# Patient Record
Sex: Female | Born: 1950 | Race: White | Hispanic: No | Smoking: Former smoker
Health system: Southern US, Community
[De-identification: ages and names within clinical notes are randomized; demographics above are authoritative.]

## PROBLEM LIST (undated history)

## (undated) DIAGNOSIS — E039 Hypothyroidism, unspecified: Secondary | ICD-10-CM

## (undated) DIAGNOSIS — N189 Chronic kidney disease, unspecified: Secondary | ICD-10-CM

## (undated) DIAGNOSIS — T8859XA Other complications of anesthesia, initial encounter: Secondary | ICD-10-CM

## (undated) DIAGNOSIS — Z87891 Personal history of nicotine dependence: Principal | ICD-10-CM

## (undated) HISTORY — PX: ABDOMINAL HYSTERECTOMY: SHX81

## (undated) HISTORY — PX: TONSILLECTOMY: SUR1361

## (undated) HISTORY — PX: OTHER SURGICAL HISTORY: SHX169

## (undated) HISTORY — DX: Personal history of nicotine dependence: Z87.891

---

## 1978-07-28 HISTORY — PX: ABDOMINAL HYSTERECTOMY: SHX81

## 2010-06-02 ENCOUNTER — Emergency Department: Payer: Self-pay | Admitting: Emergency Medicine

## 2014-01-06 ENCOUNTER — Ambulatory Visit: Payer: Self-pay | Admitting: Internal Medicine

## 2014-12-15 ENCOUNTER — Other Ambulatory Visit: Payer: Self-pay | Admitting: Family Medicine

## 2014-12-15 ENCOUNTER — Ambulatory Visit
Admission: RE | Admit: 2014-12-15 | Discharge: 2014-12-15 | Disposition: A | Discharge status: Home / Self Care | Payer: 59 | Source: Ambulatory Visit | Attending: Family Medicine | Admitting: Family Medicine

## 2014-12-15 ENCOUNTER — Inpatient Hospital Stay: Payer: 59 | Attending: Family Medicine | Admitting: Family Medicine

## 2014-12-15 DIAGNOSIS — Z87891 Personal history of nicotine dependence: Secondary | ICD-10-CM | POA: Insufficient documentation

## 2014-12-15 DIAGNOSIS — Z720 Tobacco use: Secondary | ICD-10-CM

## 2014-12-15 DIAGNOSIS — Z122 Encounter for screening for malignant neoplasm of respiratory organs: Secondary | ICD-10-CM

## 2014-12-15 NOTE — Progress Notes (Signed)
In accordance with CMS guidelines, patient has meet eligibility criteria including age, absence of signs or symptoms of lung cancer, the specific calculation of cigarette smoking pack-years was 49 years and is a former smoker that quit 3 years ago.   A shared decision-making session was conducted prior to the performance of CT scan. This includes one or more decision aids, includes benefits and harms of screening, follow-up diagnostic testing, over-diagnosis, false positive rate, and total radiation exposure.  Counseling on the importance of adherence to annual lung cancer LDCT screening, impact of co-morbidities, and ability or willingness to undergo diagnosis and treatment is imperative for compliance of the program.  Counseling on the importance of continued smoking cessation for former smokers; the importance of smoking cessation for current smokers and information about tobacco cessation interventions have been given to patient including the [place] [facility], [address] quit [place], as well as Cancer Center specific smoking cessation programs.  Written order for lung cancer screening with LDCT has been given to the patient and any and all questions have been answered to the best of my abilities.   Yearly follow up will be scheduled by Glenna FellowsShawn Perkins, Thoracic Navigator.

## 2014-12-18 ENCOUNTER — Telehealth: Payer: Self-pay | Admitting: *Deleted

## 2014-12-18 NOTE — Telephone Encounter (Signed)
Oncology Nurse Navigator Documentation  Oncology Nurse Navigator Flowsheets 12/18/2014  Navigator Encounter Type Screening;Education  Interventions Coordination of Care  Coordination of Care Radiology  Time Spent with Patient 30   Notified patient of LDCT lung cancer screening results of Lung [place]   1  finding with recommendation for 12 month follow up imaging. Also notified of incidental finding of hepatic cysts and mild emphysema.A copy of this report will be sent to Dr. Dareen PianoAnderson.

## 2015-11-26 ENCOUNTER — Telehealth: Payer: Self-pay | Admitting: *Deleted

## 2015-11-26 NOTE — Telephone Encounter (Signed)
Attempted to notify patient that it is time to schedule the recommended follow up lung cancer screening low dose CT scan. Unable to leave a message on patients cell phone.

## 2015-12-10 ENCOUNTER — Telehealth: Payer: Self-pay | Admitting: *Deleted

## 2015-12-10 NOTE — Telephone Encounter (Signed)
Unable to successfully reach patient by phone. A letter has been sent informing patient it is time for their recommended follow up low dose CT scan for lung cancer screening. 

## 2015-12-18 ENCOUNTER — Telehealth: Payer: Self-pay | Admitting: *Deleted

## 2015-12-18 NOTE — Telephone Encounter (Signed)
Notified patient that it is time to have lung cancer screening scan done. Verified patient's age, no signs of lung cancer, no illness that would prevent patient from receiving treatment for lung cancer, and smoking history (former, quit 2013, 49 pack year history). Patient is expecting call from scheduling with appointment for screening scan and knows to call me with any questions. Initial shared decision making visit was 12/15/14.

## 2016-01-02 ENCOUNTER — Encounter: Payer: Self-pay | Admitting: Family Medicine

## 2016-01-02 ENCOUNTER — Other Ambulatory Visit: Payer: Self-pay | Admitting: Family Medicine

## 2016-01-02 DIAGNOSIS — Z87891 Personal history of nicotine dependence: Secondary | ICD-10-CM

## 2016-01-02 HISTORY — DX: Personal history of nicotine dependence: Z87.891

## 2016-01-16 ENCOUNTER — Ambulatory Visit
Admission: RE | Admit: 2016-01-16 | Discharge: 2016-01-16 | Disposition: A | Discharge status: Home / Self Care | Payer: BLUE CROSS/BLUE SHIELD | Source: Ambulatory Visit | Attending: Family Medicine | Admitting: Family Medicine

## 2016-01-16 DIAGNOSIS — Z87891 Personal history of nicotine dependence: Secondary | ICD-10-CM | POA: Insufficient documentation

## 2016-12-16 ENCOUNTER — Telehealth: Payer: Self-pay | Admitting: *Deleted

## 2016-12-16 ENCOUNTER — Telehealth: Payer: Self-pay

## 2016-12-16 DIAGNOSIS — Z87891 Personal history of nicotine dependence: Secondary | ICD-10-CM

## 2016-12-16 NOTE — Telephone Encounter (Signed)
Spoke with patient.  01/16/17 @ 10am arrive 15 min early.  [facility]

## 2016-12-16 NOTE — Telephone Encounter (Signed)
Notified patient that annual lung cancer screening low dose CT scan is due currently or will be in near future. Confirmed that patient is within the age range of 55-77, and asymptomatic, (no signs or symptoms of lung cancer). Patient denies illness that would prevent curative treatment for lung cancer if found. Verified smoking history, (former, quit 2013, 49 pack year). The shared decision making visit was done 12/15/14. Patient is agreeable for CT scan being scheduled.

## 2017-01-02 ENCOUNTER — Other Ambulatory Visit
Admission: RE | Admit: 2017-01-02 | Discharge: 2017-01-02 | Disposition: A | Discharge status: Home / Self Care | Payer: Medicare Other | Source: Ambulatory Visit | Attending: Internal Medicine | Admitting: Internal Medicine

## 2017-01-02 DIAGNOSIS — E039 Hypothyroidism, unspecified: Secondary | ICD-10-CM | POA: Diagnosis present

## 2017-01-02 DIAGNOSIS — E782 Mixed hyperlipidemia: Secondary | ICD-10-CM | POA: Insufficient documentation

## 2017-01-02 DIAGNOSIS — G43709 Chronic migraine without aura, not intractable, without status migrainosus: Secondary | ICD-10-CM | POA: Insufficient documentation

## 2017-01-02 LAB — BASIC METABOLIC PANEL
Anion gap: 8 (ref 5–15)
BUN: 16 mg/dL (ref 6–20)
CHLORIDE: 106 mmol/L (ref 101–111)
CO2: 26 mmol/L (ref 22–32)
CREATININE: 0.9 mg/dL (ref 0.44–1.00)
Calcium: 9.1 mg/dL (ref 8.9–10.3)
GFR calc Af Amer: >60 mL/min (ref >60)
GFR calc non Af Amer: >60 mL/min (ref >60)
Glucose, Bld: 93 mg/dL (ref 65–99)
POTASSIUM: 3.8 mmol/L (ref 3.5–5.1)
Sodium: 140 mmol/L (ref 135–145)

## 2017-01-02 LAB — HEPATIC FUNCTION PANEL
ALBUMIN: 4.2 g/dL (ref 3.5–5.0)
ALK PHOS: 77 U/L (ref 38–126)
ALT: 15 U/L (ref 14–54)
AST: 20 U/L (ref 15–41)
BILIRUBIN TOTAL: 0.9 mg/dL (ref 0.3–1.2)
Bilirubin, Direct: 0.1 mg/dL (ref 0.1–0.5)
Indirect Bilirubin: 0.8 mg/dL (ref 0.3–0.9)
TOTAL PROTEIN: 7 g/dL (ref 6.5–8.1)

## 2017-01-02 LAB — LIPID PANEL
Cholesterol: 217 mg/dL — ABNORMAL HIGH (ref 0–200)
HDL: 43 mg/dL (ref >40)
LDL CALC: 159 mg/dL — AB (ref 0–99)
Total CHOL/HDL Ratio: 5 RATIO
Triglycerides: 76 mg/dL (ref <150)
VLDL: 15 mg/dL (ref 0–40)

## 2017-01-02 LAB — TSH: TSH: 0.037 u[IU]/mL — AB (ref 0.350–4.500)

## 2017-01-16 ENCOUNTER — Ambulatory Visit: Admission: RE | Admit: 2017-01-16 | Payer: Medicare Other | Source: Ambulatory Visit

## 2017-02-05 ENCOUNTER — Telehealth: Payer: Self-pay | Admitting: *Deleted

## 2017-02-05 NOTE — Telephone Encounter (Signed)
Attempted to follow up on no show lung screening CT scan appt. Unable to leave voicemail. Letter will be mailed to patient.

## 2017-02-12 ENCOUNTER — Encounter: Payer: Self-pay | Admitting: *Deleted

## 2017-02-25 ENCOUNTER — Telehealth: Payer: Self-pay | Admitting: *Deleted

## 2017-02-25 DIAGNOSIS — Z87891 Personal history of nicotine dependence: Secondary | ICD-10-CM

## 2017-02-25 NOTE — Telephone Encounter (Signed)
Patient contacted me to reschedule lung screening CT scan. See prior notes for eligibility information.

## 2017-03-05 ENCOUNTER — Ambulatory Visit
Admission: RE | Admit: 2017-03-05 | Discharge: 2017-03-05 | Disposition: A | Discharge status: Home / Self Care | Payer: Medicare Other | Source: Ambulatory Visit | Attending: Oncology | Admitting: Oncology

## 2017-03-05 DIAGNOSIS — Z87891 Personal history of nicotine dependence: Secondary | ICD-10-CM

## 2017-03-05 DIAGNOSIS — Z122 Encounter for screening for malignant neoplasm of respiratory organs: Secondary | ICD-10-CM | POA: Insufficient documentation

## 2017-03-09 ENCOUNTER — Encounter: Payer: Self-pay | Admitting: *Deleted

## 2018-02-25 ENCOUNTER — Telehealth: Payer: Self-pay | Admitting: Nurse Practitioner

## 2018-02-26 ENCOUNTER — Telehealth: Payer: Self-pay | Admitting: *Deleted

## 2018-02-26 DIAGNOSIS — Z122 Encounter for screening for malignant neoplasm of respiratory organs: Secondary | ICD-10-CM

## 2018-02-26 DIAGNOSIS — Z87891 Personal history of nicotine dependence: Secondary | ICD-10-CM

## 2018-02-26 NOTE — Telephone Encounter (Signed)
Notified patient that annual lung cancer screening low dose CT scan is due currently or will be in near future. Confirmed that patient is within the age range of 55-77, and asymptomatic, (no signs or symptoms of lung cancer). Patient denies illness that would prevent curative treatment for lung cancer if found. Verified smoking history, (former, quit 2013, 49 pack year). The shared decision making visit was done 12/15/14. Patient is agreeable for CT scan being scheduled.

## 2018-03-05 ENCOUNTER — Ambulatory Visit
Admission: RE | Admit: 2018-03-05 | Discharge: 2018-03-05 | Disposition: A | Discharge status: Home / Self Care | Payer: Medicare Other | Source: Ambulatory Visit | Attending: Oncology | Admitting: Oncology

## 2018-03-05 DIAGNOSIS — Z87891 Personal history of nicotine dependence: Secondary | ICD-10-CM | POA: Diagnosis not present

## 2018-03-05 DIAGNOSIS — J984 Other disorders of lung: Secondary | ICD-10-CM | POA: Insufficient documentation

## 2018-03-05 DIAGNOSIS — J9811 Atelectasis: Secondary | ICD-10-CM | POA: Diagnosis not present

## 2018-03-05 DIAGNOSIS — I700 Atherosclerosis of aorta: Secondary | ICD-10-CM | POA: Insufficient documentation

## 2018-03-05 DIAGNOSIS — Z122 Encounter for screening for malignant neoplasm of respiratory organs: Secondary | ICD-10-CM | POA: Diagnosis not present

## 2018-03-05 DIAGNOSIS — J432 Centrilobular emphysema: Secondary | ICD-10-CM | POA: Diagnosis not present

## 2018-03-16 ENCOUNTER — Encounter: Payer: Self-pay | Admitting: *Deleted

## 2019-03-04 ENCOUNTER — Telehealth: Payer: Self-pay | Admitting: *Deleted

## 2019-03-04 NOTE — Telephone Encounter (Signed)
Patient not available , will attempt to try calling her at a later time.

## 2019-06-17 ENCOUNTER — Encounter: Payer: Self-pay | Admitting: *Deleted

## 2019-09-02 ENCOUNTER — Other Ambulatory Visit: Payer: Self-pay

## 2019-09-02 ENCOUNTER — Ambulatory Visit: Payer: Medicare Other | Attending: Internal Medicine

## 2019-09-02 DIAGNOSIS — Z23 Encounter for immunization: Secondary | ICD-10-CM | POA: Insufficient documentation

## 2019-09-02 NOTE — Progress Notes (Signed)
   Covid-19 Vaccination Clinic  Name:  Diana Wright    MRN: [id number] DOB: 07/03/1951  09/02/2019  [name] was observed post Covid-19 immunization for 15 minutes without incidence. She was provided with Vaccine Information Sheet and instruction to access the V-Safe system.   [name] was instructed to call 911 with any severe reactions post vaccine: Marland Kitchen Difficulty breathing  . Swelling of your face and throat  . A fast heartbeat  . A bad rash all over your body  . Dizziness and weakness    Immunizations Administered    Name Date Dose VIS Date Route   Moderna COVID-19 Vaccine 09/02/2019  4:15 PM 0.5 mL 06/28/2019 Intramuscular   Manufacturer: Moderna   Lot: [id number]   NDC: 41597-331-25

## 2019-10-04 ENCOUNTER — Ambulatory Visit: Payer: Medicare Other | Attending: Internal Medicine

## 2019-10-04 DIAGNOSIS — Z23 Encounter for immunization: Secondary | ICD-10-CM

## 2019-10-04 NOTE — Progress Notes (Signed)
   Covid-19 Vaccination Clinic  Name:  Diana Wright    MRN: [id number] DOB: 1951/05/26  10/04/2019  [name] was observed post Covid-19 immunization for 15 minutes without incident. She was provided with Vaccine Information Sheet and instruction to access the V-Safe system.   [name] was instructed to call 911 with any severe reactions post vaccine: Marland Kitchen Difficulty breathing  . Swelling of face and throat  . A fast heartbeat  . A bad rash all over body  . Dizziness and weakness   Immunizations Administered    Name Date Dose VIS Date Route   Moderna COVID-19 Vaccine 10/04/2019  3:23 PM 0.5 mL 06/28/2019 Intramuscular   Manufacturer: Moderna   Lot: [id number]   NDC: 24932-419-91

## 2020-05-24 ENCOUNTER — Encounter: Payer: Medicare Other | Attending: Internal Medicine | Admitting: Dietician

## 2020-05-24 ENCOUNTER — Other Ambulatory Visit: Payer: Self-pay

## 2020-05-24 VITALS — Ht 62.0 in | Wt 152.6 lb

## 2020-05-24 DIAGNOSIS — E785 Hyperlipidemia, unspecified: Secondary | ICD-10-CM | POA: Insufficient documentation

## 2020-05-24 DIAGNOSIS — N1831 Chronic kidney disease, stage 3a: Secondary | ICD-10-CM | POA: Insufficient documentation

## 2020-05-24 DIAGNOSIS — E7800 Pure hypercholesterolemia, unspecified: Secondary | ICD-10-CM

## 2020-05-24 DIAGNOSIS — Z713 Dietary counseling and surveillance: Secondary | ICD-10-CM | POA: Diagnosis present

## 2020-05-24 NOTE — Patient Instructions (Addendum)
   Switch to unsweet tea with stevia or and try sparkling water   Eat breakfast, even if it's something small   Snack smarter, protein + carb

## 2020-05-24 NOTE — Progress Notes (Signed)
Medical Nutrition Therapy: Visit start time: 1330  end time: 1430  Assessment:  Diagnosis: high cholesterol, CKD III Past medical history: none Psychosocial issues/ stress concerns: pt rates stress level as "low" and feels "very well" about stress management skills   Preferred learning method:  . Auditory . Visual . Hands-on  Current weight: 152.6 lbs  Height: 5'2" Medications, supplements: reconciled in medical record  Labs total cholesterol 245(H), LDL 176(H),   Progress and evaluation:   Pt reports she has made several changes since her new dx of CKD- nolonger drinking diet dr pepper switched to sweet tea, cut down bacon intake, cut down on bread intake    Pt reports she does not like drinking water    Physical activity: walking, 3 days/week   Dietary Intake:  Usual eating pattern includes 2 meals and 1-2 snacks per day. Dining out frequency: 1 meals per week.  Breakfast: skips  Lunch (11:30a-12p): roast (potatoes, carrots, onions), veggie soup with 20 crackers; sandwich pimento cheese, egg salad  Snack: same as above  Supper: hamburger with fires; air fried chicken wings, rotisserie chicken  Snack: candy, potato chips, cookies, cake Beverages: coffee with milk with stevia, 4 oz water, 24 oz sweet tea    Nutrition Care Education: Basic nutrition: basic food groups, appropriate nutrient balance, appropriate meal and snack schedule, general nutrition guidelines    Weight control: determining reasonable weight loss rate, importance of low sugar and low fat choices Advanced nutrition:  recipe modification, cooking techniques, dining out, food label reading CKD:  importance of controlling BP, identifying high sodium foods, moderate protein intake, identifying food sources of potassium, magnesium Hyperlipidemia: healthy and unhealthy fats, role of fiber, plant sterols, role of exercise  Nutritional Diagnosis:  [place]-2.2 Altered nutrition-related laboratory As related to new dx CKD  stage 3a.  As evidenced by GFR 49.  Intervention:  Discussion and instruction as noted above.  Established goals for additional changes.    Maintain adequate fruit and vegetable intake   Incorporate vegetables at lunch and dinner   Incorporate more F/V at snacks  Do not skip breakfast, include whole fruit at breakfast   Try frozen veggies that come in microwaveable pouches (may be tender than fresh and low prep time)  Try canned fruit NOT in heavy syrup (mandarin oranges, peaches, pears, applesauce) for snacks + protein (cheese, PB)   Decrease sugar-sweetened beverage consumption   Switch from sweet to unsweet tea with sugar substitute   Drink at least 64oz water daily (add crystal light, lemon, or mio as needed)    Maintain physical activity   150 minutes per week is recommendation    Increase fiber intake   Eat at least 3 servings of whole grains a day  Increase fruit and vegetable intake  Decrease sodium intake   Reduce sodium intake to recommended 1500mg /day   Read nutrition labels on packaged foods to monitor sodium intake   400-600 mg/meal  <200 mg/snack   Decrease fast food frequency   Avoid processed meats    Decrease saturated fat intake   Try more plant-based sources of protein  Limit processed meats   Switch to low fat dairy products  Education Materials given:  . NCM CKD Nutrition Therapy  . My Personal Plan  . Quick and balanced meals  . Snacking handout . Goals/ instructions  Learner/ who was taught:  . Patient   Level of understanding: Verbalizes/ demonstrates competency  Demonstrated degree of understanding via:   Teach back Learning  barriers: . None  Willingness to learn/ readiness for change: . Eager, change in progress  Monitoring and Evaluation:  Dietary intake, exercise, GFR, and body weight      follow up: December 9 at 1:30p

## 2020-05-29 ENCOUNTER — Telehealth: Payer: Self-pay | Admitting: *Deleted

## 2020-05-29 NOTE — Telephone Encounter (Signed)
Twice, patient has called and left voicemails in attempt to schedule lung screening scan. Both instances, I have returned call but there is no answer or voicemail option.

## 2020-06-05 ENCOUNTER — Telehealth: Payer: Self-pay | Admitting: *Deleted

## 2020-06-05 ENCOUNTER — Encounter: Payer: Self-pay | Admitting: *Deleted

## 2020-06-05 NOTE — Telephone Encounter (Signed)
Attempted to contact and schedule annual lung screening scan. However, there is no answer or voicemail option. Will send mychart message.

## 2020-07-05 ENCOUNTER — Ambulatory Visit: Payer: PRIVATE HEALTH INSURANCE | Admitting: Dietician

## 2020-07-13 ENCOUNTER — Encounter: Payer: Self-pay | Admitting: Dietician

## 2020-07-13 NOTE — Progress Notes (Signed)
Pt cancelled her follow up appointment for 12/9 stating that she is doing well at this time.  Encouraged pt to give [place] a call if interested in scheduling another appointment.

## 2020-08-01 ENCOUNTER — Encounter: Payer: Self-pay | Admitting: *Deleted

## 2020-08-13 ENCOUNTER — Other Ambulatory Visit: Payer: Self-pay | Admitting: *Deleted

## 2020-08-13 DIAGNOSIS — Z122 Encounter for screening for malignant neoplasm of respiratory organs: Secondary | ICD-10-CM

## 2020-08-13 DIAGNOSIS — Z87891 Personal history of nicotine dependence: Secondary | ICD-10-CM

## 2020-08-13 NOTE — Progress Notes (Signed)
Contacted and scheduled for annual lung screening scan. Patient is a former smoker, quit 2013, 49 pack year history

## 2020-08-27 ENCOUNTER — Ambulatory Visit
Admission: RE | Admit: 2020-08-27 | Discharge: 2020-08-27 | Disposition: A | Discharge status: Home / Self Care | Payer: Medicare Other | Source: Ambulatory Visit | Attending: Oncology | Admitting: Oncology

## 2020-08-27 ENCOUNTER — Other Ambulatory Visit: Payer: Self-pay

## 2020-08-27 DIAGNOSIS — Z122 Encounter for screening for malignant neoplasm of respiratory organs: Secondary | ICD-10-CM | POA: Diagnosis present

## 2020-08-27 DIAGNOSIS — Z87891 Personal history of nicotine dependence: Secondary | ICD-10-CM | POA: Diagnosis present

## 2020-08-29 ENCOUNTER — Encounter: Payer: Self-pay | Admitting: *Deleted

## 2021-09-25 ENCOUNTER — Telehealth: Payer: Self-pay | Admitting: *Deleted

## 2021-09-25 NOTE — Telephone Encounter (Signed)
Not Available - Attempted to call to schedule yearly Lung CA CT scan.No answer, no VM for both numbers listed set up. ?

## 2021-09-27 NOTE — Addendum Note (Signed)
Encounter addended by: Novella Olive on: 09/27/2021 12:11 PM  Actions taken: Letter saved

## 2021-10-03 NOTE — Telephone Encounter (Signed)
Attempted to contact x 2 by phone at number left on VM.  No answer and no vm set up. Unable to leave message ?

## 2021-10-04 ENCOUNTER — Other Ambulatory Visit: Payer: Self-pay

## 2021-10-04 DIAGNOSIS — Z87891 Personal history of nicotine dependence: Secondary | ICD-10-CM

## 2021-10-24 ENCOUNTER — Ambulatory Visit
Admission: RE | Admit: 2021-10-24 | Discharge: 2021-10-24 | Disposition: A | Discharge status: Home / Self Care | Payer: Medicare Other | Source: Ambulatory Visit | Attending: Acute Care | Admitting: Acute Care

## 2021-10-24 DIAGNOSIS — Z87891 Personal history of nicotine dependence: Secondary | ICD-10-CM | POA: Insufficient documentation

## 2021-10-25 ENCOUNTER — Other Ambulatory Visit: Payer: Self-pay

## 2021-10-25 DIAGNOSIS — Z87891 Personal history of nicotine dependence: Secondary | ICD-10-CM

## 2022-09-05 ENCOUNTER — Other Ambulatory Visit: Payer: Self-pay

## 2022-09-05 ENCOUNTER — Encounter: Payer: Self-pay | Admitting: Ophthalmology

## 2022-09-08 NOTE — Discharge Instructions (Signed)

## 2022-09-10 ENCOUNTER — Ambulatory Visit: Payer: Medicare Other | Admitting: Anesthesiology

## 2022-09-10 ENCOUNTER — Ambulatory Visit
Admission: RE | Admit: 2022-09-10 | Discharge: 2022-09-10 | Disposition: A | Discharge status: Home / Self Care | Payer: Medicare Other | Source: Ambulatory Visit | Attending: Ophthalmology | Admitting: Ophthalmology

## 2022-09-10 ENCOUNTER — Encounter: Payer: Self-pay | Admitting: Ophthalmology

## 2022-09-10 ENCOUNTER — Encounter
Admission: RE | Disposition: A | Discharge status: Home / Self Care | Payer: Self-pay | Source: Ambulatory Visit | Attending: Ophthalmology

## 2022-09-10 ENCOUNTER — Other Ambulatory Visit: Payer: Self-pay

## 2022-09-10 DIAGNOSIS — E039 Hypothyroidism, unspecified: Secondary | ICD-10-CM | POA: Insufficient documentation

## 2022-09-10 DIAGNOSIS — N189 Chronic kidney disease, unspecified: Secondary | ICD-10-CM | POA: Insufficient documentation

## 2022-09-10 DIAGNOSIS — H2511 Age-related nuclear cataract, right eye: Secondary | ICD-10-CM | POA: Diagnosis present

## 2022-09-10 DIAGNOSIS — Z87891 Personal history of nicotine dependence: Secondary | ICD-10-CM | POA: Diagnosis not present

## 2022-09-10 HISTORY — DX: Other complications of anesthesia, initial encounter: T88.59XA

## 2022-09-10 HISTORY — PX: CATARACT EXTRACTION W/PHACO: SHX586

## 2022-09-10 HISTORY — DX: Chronic kidney disease, unspecified: N18.9

## 2022-09-10 HISTORY — DX: Hypothyroidism, unspecified: E03.9

## 2022-09-10 SURGERY — PHACOEMULSIFICATION, CATARACT, WITH IOL INSERTION
Anesthesia: Monitor Anesthesia Care | Site: Eye | Laterality: Right

## 2022-09-10 MED ORDER — TETRACAINE HCL 0.5 % OP SOLN
1.0000 [drp] | OPHTHALMIC | Status: DC | PRN
  Administered 2022-09-10 (×3): 1 [drp] via OPHTHALMIC

## 2022-09-10 MED ORDER — SIGHTPATH DOSE#1 BSS IO SOLN
INTRAOCULAR | Status: DC | PRN
  Administered 2022-09-10: 97 mL via OPHTHALMIC

## 2022-09-10 MED ORDER — LACTATED RINGERS IV SOLN: INTRAVENOUS | Status: DC

## 2022-09-10 MED ORDER — CEFUROXIME OPHTHALMIC INJECTION 1 MG/0.1 ML
INJECTION | OPHTHALMIC | Status: DC | PRN
  Administered 2022-09-10: .1 mL via INTRACAMERAL

## 2022-09-10 MED ORDER — ARMC OPHTHALMIC DILATING DROPS
1.0000 | OPHTHALMIC | Status: DC | PRN
  Administered 2022-09-10 (×3): 1 via OPHTHALMIC

## 2022-09-10 MED ORDER — FENTANYL CITRATE (PF) 100 MCG/2ML IJ SOLN
INTRAMUSCULAR | Status: DC | PRN
  Administered 2022-09-10 (×2): 50 ug via INTRAVENOUS

## 2022-09-10 MED ORDER — SIGHTPATH DOSE#1 BSS IO SOLN
INTRAOCULAR | Status: DC | PRN
  Administered 2022-09-10: 15 mL

## 2022-09-10 MED ORDER — MIDAZOLAM HCL 2 MG/2ML IJ SOLN
INTRAMUSCULAR | Status: DC | PRN
  Administered 2022-09-10: 2 mg via INTRAVENOUS

## 2022-09-10 MED ORDER — SIGHTPATH DOSE#1 BSS IO SOLN
INTRAOCULAR | Status: DC | PRN
  Administered 2022-09-10: 1 mL via INTRAMUSCULAR

## 2022-09-10 MED ORDER — SIGHTPATH DOSE#1 NA HYALUR & NA CHOND-NA HYALUR IO KIT
PACK | INTRAOCULAR | Status: DC | PRN
  Administered 2022-09-10: 1 via OPHTHALMIC

## 2022-09-10 MED ORDER — BRIMONIDINE TARTRATE-TIMOLOL 0.2-0.5 % OP SOLN
OPHTHALMIC | Status: DC | PRN
  Administered 2022-09-10: 1 [drp] via OPHTHALMIC

## 2022-09-10 SURGICAL SUPPLY — 11 items
CATARACT SUITE SIGHTPATH (MISCELLANEOUS) ×1 IMPLANT
FEE CATARACT SUITE SIGHTPATH (MISCELLANEOUS) ×2 IMPLANT
GLOVE SRG 8 PF TXTR STRL LF DI (GLOVE) ×2 IMPLANT
GLOVE SURG ENC TEXT LTX SZ7.5 (GLOVE) ×2 IMPLANT
GLOVE SURG UNDER POLY LF SZ8 (GLOVE) ×1
LENS CLAREON VIVITY 20 ×1 IMPLANT
LENS IOL CLRN VT YLW 20.0 IMPLANT
NDL FILTER BLUNT 18X1 1/2 (NEEDLE) ×2 IMPLANT
NEEDLE FILTER BLUNT 18X1 1/2 (NEEDLE) ×1 IMPLANT
SYR 3ML LL SCALE MARK (SYRINGE) ×2 IMPLANT
WATER STERILE IRR 250ML POUR (IV SOLUTION) ×2 IMPLANT

## 2022-09-10 NOTE — Transfer of Care (Signed)
Immediate Anesthesia Transfer of Care Note  Patient: Diana Wright  Procedure(s) Performed: CATARACT EXTRACTION PHACO AND INTRAOCULAR LENS PLACEMENT (IOC) RIGHT CLAREON VIVITY TORIC  6.13  00:50.1 (Right: Eye)  Patient Location: PACU  Anesthesia Type: MAC  Level of Consciousness: awake, alert  and patient cooperative  Airway and Oxygen Therapy: Patient Spontanous Breathing and Patient connected to supplemental oxygen  Post-op Assessment: Post-op Vital signs reviewed, Patient's Cardiovascular Status Stable, Respiratory Function Stable, Patent Airway and No signs of Nausea or vomiting  Post-op Vital Signs: Reviewed and stable  Complications: No notable events documented.

## 2022-09-10 NOTE — H&P (Signed)
  [facility]   Primary Care Physician:  Kirk Ruths, MD Ophthalmologist: Dr. Leandrew Koyanagi  Pre-Procedure History & Physical: HPI:  Diana Wright is a 72 y.o. female here for ophthalmic surgery.   Past Medical History:  Diagnosis Date   Chronic kidney disease    Complication of anesthesia    Hypothyroidism    Personal history of tobacco use, presenting hazards to health 01/02/2016    Past Surgical History:  Procedure Laterality Date   ABDOMINAL HYSTERECTOMY     knee cystectomy     TONSILLECTOMY      Prior to Admission medications   Medication Sig Start Date End Date Taking? Authorizing Provider  acetaminophen (TYLENOL) 325 MG suppository Place 325 mg rectally every 4 (four) hours as needed.   Yes [provider]  EUTHYROX 88 MCG tablet  04/16/20  Yes [provider]  ibuprofen (ADVIL) 200 MG tablet Take 200 mg by mouth every 6 (six) hours as needed.   Yes [provider]    Allergies as of 08/20/2022   (Not on File)    History reviewed. No pertinent family history.  Social History   Socioeconomic History   Marital status: Widowed    Spouse name: Not on file   Number of children: Not on file   Years of education: Not on file   Highest education level: Not on file  Occupational History   Not on file  Tobacco Use   Smoking status: Former    Types: Cigarettes    Quit date: 09/09/2011    Years since quitting: 11.0   Smokeless tobacco: Never  Substance and Sexual Activity   Alcohol use: Not on file   Drug use: Not on file   Sexual activity: Not on file  Other Topics Concern   Not on file  Social History Narrative   Not on file   Social Determinants of Health   Financial Resource Strain: Not on file  Food Insecurity: Not on file  Transportation Needs: Not on file  Physical Activity: Not on file  Stress: Not on file  Social Connections: Not on file  Intimate Partner Violence: Not on file    Review of  Systems: See HPI, otherwise negative ROS  Physical Exam: BP (!) 152/93   Pulse 68   Temp (!) 97.1 F (36.2 C) (Temporal)   Ht 5\' 2"  (1.575 m)   Wt 68.5 kg   SpO2 98%   BMI 27.62 kg/m  General:   Alert,  pleasant and cooperative in NAD Head:  Normocephalic and atraumatic. Lungs:  Clear to auscultation.    Heart:  Regular rate and rhythm.   Impression/Plan: Diana Wright is here for ophthalmic surgery.  Risks, benefits, limitations, and alternatives regarding ophthalmic surgery have been reviewed with the patient.  Questions have been answered.  All parties agreeable.   Leandrew Koyanagi, MD  09/10/2022, 8:30 AM

## 2022-09-10 NOTE — Op Note (Signed)
LOCATION:  [place]   PREOPERATIVE DIAGNOSIS:    Nuclear sclerotic cataract right eye. H25.11   POSTOPERATIVE DIAGNOSIS:  Nuclear sclerotic cataract right eye.     PROCEDURE:  Phacoemusification with posterior chamber intraocular lens placement of the right eye   ULTRASOUND TIME: Procedure(s): CATARACT EXTRACTION PHACO AND INTRAOCULAR LENS PLACEMENT (IOC) RIGHT CLAREON VIVITY TORIC  6.13  00:50.1 (Right)  LENS:   Implant Name Type Inv. Item Serial No. Manufacturer Lot No. LRB No. Used Action  LENS CLAREON VIVITY 20 - [id number]  LENS CLAREON VIVITY 20 [id number] [employer]  Right 1 Implanted      CNWET0 20.0   SURGEON:  Wyonia Hough, MD   ANESTHESIA:  Topical with tetracaine drops and 2% Xylocaine jelly, augmented with 1% preservative-free intracameral lidocaine.    COMPLICATIONS:  None.   DESCRIPTION OF PROCEDURE:  The patient was identified in the holding room and transported to the operating room and placed in the supine position under the operating microscope.  The right eye was identified as the operative eye and it was prepped and draped in the usual sterile ophthalmic fashion.   A 1 millimeter clear-corneal paracentesis was made at the 12:00 position.  0.5 ml of preservative-free 1% lidocaine was injected into the anterior chamber. The anterior chamber was filled with Viscoat viscoelastic.  A 2.4 millimeter keratome was used to make a near-clear corneal incision at the 9:00 position.  A curvilinear capsulorrhexis was made with a cystotome and capsulorrhexis forceps.  Balanced salt solution was used to hydrodissect and hydrodelineate the nucleus.   Phacoemulsification was then used in stop and chop fashion to remove the lens nucleus and epinucleus.  The remaining cortex was then removed using the irrigation and aspiration handpiece. Provisc was then placed into the capsular bag to distend it for lens placement.  A lens was then injected into the capsular  bag.  The remaining viscoelastic was aspirated.   Wounds were hydrated with balanced salt solution.  The anterior chamber was inflated to a physiologic pressure with balanced salt solution.  No wound leaks were noted. Cefuroxime 0.1 ml of a 4m/ml solution was injected into the anterior chamber for a dose of 1 mg of intracameral antibiotic at the completion of the case.   Timolol and Brimonidine drops were applied to the eye.  The patient was taken to the recovery room in stable condition without complications of anesthesia or surgery.   Erika Slaby 09/10/2022, 9:17 AM

## 2022-09-10 NOTE — Anesthesia Postprocedure Evaluation (Signed)
Anesthesia Post Note  Patient: DONETTA NIEMEYER  Procedure(s) Performed: CATARACT EXTRACTION PHACO AND INTRAOCULAR LENS PLACEMENT (IOC) RIGHT CLAREON VIVITY TORIC  6.13  00:50.1 (Right: Eye)  Patient location during evaluation: PACU Anesthesia Type: MAC Level of consciousness: awake and alert Pain management: pain level controlled Vital Signs Assessment: post-procedure vital signs reviewed and stable Respiratory status: spontaneous breathing, nonlabored ventilation, respiratory function stable and patient connected to nasal cannula oxygen Cardiovascular status: stable and blood pressure returned to baseline Postop Assessment: no apparent nausea or vomiting Anesthetic complications: no   No notable events documented.   Last Vitals:  Vitals:   09/10/22 0918 09/10/22 0924  BP: 108/71 (!) 107/90  Pulse: 60 60  Resp: 18 17  Temp: (!) 36.2 C (!) 36.2 C  SpO2: 96% 94%    Last Pain:  Vitals:   09/10/22 0801  TempSrc: Temporal                 Martha Clan

## 2022-09-10 NOTE — Anesthesia Preprocedure Evaluation (Signed)
Anesthesia Evaluation  Patient identified by MRN, date of birth, ID band Patient awake    Reviewed: Allergy & Precautions, H&P , NPO status , Patient's Chart, lab work & pertinent test results, reviewed documented beta blocker date and time   History of Anesthesia Complications Negative for: history of anesthetic complications  Airway Mallampati: II  TM Distance: >3 FB Neck ROM: full    Dental  (+) Caps, Dental Advidsory Given, Poor Dentition   Pulmonary neg pulmonary ROS, former smoker   Pulmonary exam normal breath sounds clear to auscultation       Cardiovascular Exercise Tolerance: Good negative cardio ROS Normal cardiovascular exam Rhythm:regular Rate:Normal     Neuro/Psych negative neurological ROS  negative psych ROS   GI/Hepatic negative GI ROS, Neg liver ROS,,,  Endo/Other  neg diabetesHypothyroidism    Renal/GU CRFRenal disease  negative genitourinary   Musculoskeletal   Abdominal   Peds  Hematology negative hematology ROS (+)   Anesthesia Other Findings Past Medical History: No date: Chronic kidney disease No date: Complication of anesthesia No date: Hypothyroidism 01/02/2016: Personal history of tobacco use, presenting hazards to  health   Reproductive/Obstetrics negative OB ROS                             Anesthesia Physical Anesthesia Plan  ASA: 2  Anesthesia Plan: MAC   Post-op Pain Management:    Induction: Intravenous  PONV Risk Score and Plan: 2 and Midazolam and Treatment may vary due to age or medical condition  Airway Management Planned: Natural Airway and Nasal Cannula  Additional Equipment:   Intra-op Plan:   Post-operative Plan:   Informed Consent: I have reviewed the patients History and Physical, chart, labs and discussed the procedure including the risks, benefits and alternatives for the proposed anesthesia with the patient or authorized  representative who has indicated his/her understanding and acceptance.     Dental Advisory Given  Plan Discussed with: Anesthesiologist, CRNA and Surgeon  Anesthesia Plan Comments:         Anesthesia Quick Evaluation

## 2022-09-11 ENCOUNTER — Encounter: Payer: Self-pay | Admitting: Ophthalmology

## 2022-09-14 ENCOUNTER — Other Ambulatory Visit: Payer: Self-pay | Admitting: Acute Care

## 2022-09-14 DIAGNOSIS — Z87891 Personal history of nicotine dependence: Secondary | ICD-10-CM

## 2022-09-22 NOTE — Discharge Instructions (Signed)

## 2022-09-24 ENCOUNTER — Encounter
Admission: RE | Disposition: A | Discharge status: Home / Self Care | Payer: Self-pay | Source: Ambulatory Visit | Attending: Ophthalmology

## 2022-09-24 ENCOUNTER — Encounter: Payer: Self-pay | Admitting: Ophthalmology

## 2022-09-24 ENCOUNTER — Ambulatory Visit: Payer: Medicare Other | Admitting: Anesthesiology

## 2022-09-24 ENCOUNTER — Other Ambulatory Visit: Payer: Self-pay

## 2022-09-24 ENCOUNTER — Ambulatory Visit
Admission: RE | Admit: 2022-09-24 | Discharge: 2022-09-24 | Disposition: A | Discharge status: Home / Self Care | Payer: Medicare Other | Source: Ambulatory Visit | Attending: Ophthalmology | Admitting: Ophthalmology

## 2022-09-24 DIAGNOSIS — Z09 Encounter for follow-up examination after completed treatment for conditions other than malignant neoplasm: Secondary | ICD-10-CM | POA: Diagnosis not present

## 2022-09-24 DIAGNOSIS — Z87891 Personal history of nicotine dependence: Secondary | ICD-10-CM | POA: Insufficient documentation

## 2022-09-24 DIAGNOSIS — E039 Hypothyroidism, unspecified: Secondary | ICD-10-CM | POA: Diagnosis not present

## 2022-09-24 DIAGNOSIS — N189 Chronic kidney disease, unspecified: Secondary | ICD-10-CM | POA: Diagnosis not present

## 2022-09-24 DIAGNOSIS — H2512 Age-related nuclear cataract, left eye: Secondary | ICD-10-CM | POA: Insufficient documentation

## 2022-09-24 HISTORY — PX: CATARACT EXTRACTION W/PHACO: SHX586

## 2022-09-24 SURGERY — PHACOEMULSIFICATION, CATARACT, WITH IOL INSERTION
Anesthesia: Monitor Anesthesia Care | Site: Eye | Laterality: Left

## 2022-09-24 MED ORDER — MIDAZOLAM HCL 2 MG/2ML IJ SOLN
INTRAMUSCULAR | Status: DC | PRN
  Administered 2022-09-24: 2 mg via INTRAVENOUS

## 2022-09-24 MED ORDER — TETRACAINE HCL 0.5 % OP SOLN
1.0000 [drp] | OPHTHALMIC | Status: DC | PRN
  Administered 2022-09-24 (×3): 1 [drp] via OPHTHALMIC

## 2022-09-24 MED ORDER — SIGHTPATH DOSE#1 BSS IO SOLN
INTRAOCULAR | Status: DC | PRN
  Administered 2022-09-24: 71 mL via OPHTHALMIC

## 2022-09-24 MED ORDER — ARMC OPHTHALMIC DILATING DROPS
1.0000 | OPHTHALMIC | Status: DC | PRN
  Administered 2022-09-24 (×3): 1 via OPHTHALMIC

## 2022-09-24 MED ORDER — BRIMONIDINE TARTRATE-TIMOLOL 0.2-0.5 % OP SOLN
OPHTHALMIC | Status: DC | PRN
  Administered 2022-09-24: 1 [drp] via OPHTHALMIC

## 2022-09-24 MED ORDER — LACTATED RINGERS IV SOLN: INTRAVENOUS | Status: DC

## 2022-09-24 MED ORDER — CEFUROXIME OPHTHALMIC INJECTION 1 MG/0.1 ML
INJECTION | OPHTHALMIC | Status: DC | PRN
  Administered 2022-09-24: .1 mL via INTRACAMERAL

## 2022-09-24 MED ORDER — FENTANYL CITRATE (PF) 100 MCG/2ML IJ SOLN
INTRAMUSCULAR | Status: DC | PRN
  Administered 2022-09-24 (×2): 50 ug via INTRAVENOUS

## 2022-09-24 MED ORDER — SIGHTPATH DOSE#1 BSS IO SOLN
INTRAOCULAR | Status: DC | PRN
  Administered 2022-09-24: 1 mL via INTRAMUSCULAR

## 2022-09-24 MED ORDER — SIGHTPATH DOSE#1 BSS IO SOLN
INTRAOCULAR | Status: DC | PRN
  Administered 2022-09-24: 15 mL

## 2022-09-24 MED ORDER — SIGHTPATH DOSE#1 NA HYALUR & NA CHOND-NA HYALUR IO KIT
PACK | INTRAOCULAR | Status: DC | PRN
  Administered 2022-09-24: 1 via OPHTHALMIC

## 2022-09-24 SURGICAL SUPPLY — 11 items
CATARACT SUITE SIGHTPATH (MISCELLANEOUS) ×1 IMPLANT
FEE CATARACT SUITE SIGHTPATH (MISCELLANEOUS) ×2 IMPLANT
GLOVE SRG 8 PF TXTR STRL LF DI (GLOVE) ×2 IMPLANT
GLOVE SURG ENC TEXT LTX SZ7.5 (GLOVE) ×2 IMPLANT
GLOVE SURG UNDER POLY LF SZ8 (GLOVE) ×1
LENS CLAREON VIVITY 21.0 ×1 IMPLANT
LENS IOL CLRN VT YLW 21.0 IMPLANT
NDL FILTER BLUNT 18X1 1/2 (NEEDLE) ×2 IMPLANT
NEEDLE FILTER BLUNT 18X1 1/2 (NEEDLE) ×1 IMPLANT
SYR 3ML LL SCALE MARK (SYRINGE) ×2 IMPLANT
WATER STERILE IRR 250ML POUR (IV SOLUTION) ×2 IMPLANT

## 2022-09-24 NOTE — Anesthesia Preprocedure Evaluation (Signed)
Anesthesia Evaluation  Patient identified by MRN, date of birth, ID band Patient awake    Reviewed: Allergy & Precautions, H&P , NPO status , Patient's Chart, lab work & pertinent test results, reviewed documented beta blocker date and time   History of Anesthesia Complications Negative for: history of anesthetic complications  Airway Mallampati: II  TM Distance: >3 FB Neck ROM: full    Dental  (+) Caps, Dental Advidsory Given, Poor Dentition   Pulmonary neg pulmonary ROS, former smoker   Pulmonary exam normal breath sounds clear to auscultation       Cardiovascular Exercise Tolerance: Good negative cardio ROS Normal cardiovascular exam Rhythm:regular Rate:Normal     Neuro/Psych negative neurological ROS  negative psych ROS   GI/Hepatic negative GI ROS, Neg liver ROS,,,  Endo/Other  neg diabetesHypothyroidism    Renal/GU CRFRenal disease  negative genitourinary   Musculoskeletal   Abdominal   Peds  Hematology negative hematology ROS (+)   Anesthesia Other Findings Past Medical History: No date: Chronic kidney disease No date: Complication of anesthesia No date: Hypothyroidism 01/02/2016: Personal history of tobacco use, presenting hazards to  health   Reproductive/Obstetrics negative OB ROS                              Anesthesia Physical Anesthesia Plan  ASA: 2  Anesthesia Plan: MAC   Post-op Pain Management:    Induction: Intravenous  PONV Risk Score and Plan: 2 and Midazolam and Treatment may vary due to age or medical condition  Airway Management Planned: Natural Airway and Nasal Cannula  Additional Equipment:   Intra-op Plan:   Post-operative Plan:   Informed Consent: I have reviewed the patients History and Physical, chart, labs and discussed the procedure including the risks, benefits and alternatives for the proposed anesthesia with the patient or authorized  representative who has indicated his/her understanding and acceptance.     Dental Advisory Given  Plan Discussed with: Anesthesiologist, CRNA and Surgeon  Anesthesia Plan Comments:          Anesthesia Quick Evaluation

## 2022-09-24 NOTE — Transfer of Care (Signed)
Immediate Anesthesia Transfer of Care Note  Patient: Diana Wright  Procedure(s) Performed: CATARACT EXTRACTION PHACO AND INTRAOCULAR LENS PLACEMENT (IOC) LEFT  CLAREON VIVITY TORIC  5.81  00:46.3 (Left: Eye)  Patient Location: PACU  Anesthesia Type: MAC  Level of Consciousness: awake, alert  and patient cooperative  Airway and Oxygen Therapy: Patient Spontanous Breathing and Patient connected to supplemental oxygen  Post-op Assessment: Post-op Vital signs reviewed, Patient's Cardiovascular Status Stable, Respiratory Function Stable, Patent Airway and No signs of Nausea or vomiting  Post-op Vital Signs: Reviewed and stable  Complications: No notable events documented.

## 2022-09-24 NOTE — Op Note (Signed)
OPERATIVE NOTE  Diana Wright [id number] 09/24/2022   PREOPERATIVE DIAGNOSIS:  Nuclear sclerotic cataract left eye. H25.12   POSTOPERATIVE DIAGNOSIS:    Nuclear sclerotic cataract left eye.     PROCEDURE:  Phacoemusification with posterior chamber intraocular lens placement of the left eye  Ultrasound time: Procedure(s): CATARACT EXTRACTION PHACO AND INTRAOCULAR LENS PLACEMENT ([facility]) LEFT  CLAREON VIVITY TORIC  5.81  00:46.3 (Left)  LENS:   Implant Name Type Inv. Item Serial No. Manufacturer Lot No. LRB No. Used Action  LENS CLAREON VIVITY 21.0 - [id number]  LENS CLAREON VIVITY 21.0 [id number] [employer]  Left 1 Implanted      SURGEON:  Wyonia Hough, MD   ANESTHESIA:  Topical with tetracaine drops and 2% Xylocaine jelly, augmented with 1% preservative-free intracameral lidocaine.    COMPLICATIONS:  None.   DESCRIPTION OF PROCEDURE:  The patient was identified in the holding room and transported to the operating room and placed in the supine position under the operating microscope.  The left eye was identified as the operative eye and it was prepped and draped in the usual sterile ophthalmic fashion.   A 1 millimeter clear-corneal paracentesis was made at the 1:30 position.  0.5 ml of preservative-free 1% lidocaine was injected into the anterior chamber.  The anterior chamber was filled with Viscoat viscoelastic.  A 2.4 millimeter keratome was used to make a near-clear corneal incision at the 10:30 position.  .  A curvilinear capsulorrhexis was made with a cystotome and capsulorrhexis forceps.  Balanced salt solution was used to hydrodissect and hydrodelineate the nucleus.   Phacoemulsification was then used in stop and chop fashion to remove the lens nucleus and epinucleus.  The remaining cortex was then removed using the irrigation and aspiration handpiece. Provisc was then placed into the capsular bag to distend it for lens placement.  A lens was then injected into the  capsular bag.  The remaining viscoelastic was aspirated.   Wounds were hydrated with balanced salt solution.  The anterior chamber was inflated to a physiologic pressure with balanced salt solution.  No wound leaks were noted. Cefuroxime 0.1 ml of a '10mg'$ /ml solution was injected into the anterior chamber for a dose of 1 mg of intracameral antibiotic at the completion of the case.   Timolol and Brimonidine drops were applied to the eye.  The patient was taken to the recovery room in stable condition without complications of anesthesia or surgery.  Diana Wright 09/24/2022, 11:16 AM

## 2022-09-24 NOTE — Anesthesia Postprocedure Evaluation (Signed)
Anesthesia Post Note  Patient: Diana Wright  Procedure(s) Performed: CATARACT EXTRACTION PHACO AND INTRAOCULAR LENS PLACEMENT (IOC) LEFT  CLAREON VIVITY TORIC  5.81  00:46.3 (Left: Eye)  Patient location during evaluation: PACU Anesthesia Type: MAC Level of consciousness: awake and alert Pain management: pain level controlled Vital Signs Assessment: post-procedure vital signs reviewed and stable Respiratory status: spontaneous breathing, nonlabored ventilation, respiratory function stable and patient connected to nasal cannula oxygen Cardiovascular status: blood pressure returned to baseline and stable Postop Assessment: no apparent nausea or vomiting Anesthetic complications: no   No notable events documented.   Last Vitals:  Vitals:   09/24/22 1119 09/24/22 1125  BP: 131/81 118/84  Pulse: 77 (!) 58  Resp: 16 15  Temp: (!) 36.3 C (!) 36.3 C  SpO2: 94% 94%    Last Pain:  Vitals:   09/24/22 1125  TempSrc:   PainSc: 0-No pain                 Precious Haws Vedansh Kerstetter

## 2022-09-24 NOTE — H&P (Signed)
  [facility]   Primary Care Physician:  Kirk Ruths, MD Ophthalmologist: Dr. Leandrew Koyanagi  Pre-Procedure History & Physical: HPI:  Diana Wright is a 72 y.o. female here for ophthalmic surgery.   Past Medical History:  Diagnosis Date   Chronic kidney disease    Complication of anesthesia    Hypothyroidism    Personal history of tobacco use, presenting hazards to health 01/02/2016    Past Surgical History:  Procedure Laterality Date   ABDOMINAL HYSTERECTOMY  1980   CATARACT EXTRACTION W/PHACO Right 09/10/2022   Procedure: CATARACT EXTRACTION PHACO AND INTRAOCULAR LENS PLACEMENT ([place]) RIGHT CLAREON VIVITY TORIC  6.13  00:50.1;  Surgeon: Leandrew Koyanagi, MD;  Location: [place];  Service: Ophthalmology;  Laterality: Right;   knee cystectomy     TONSILLECTOMY      Prior to Admission medications   Medication Sig Start Date End Date Taking? Authorizing Provider  EUTHYROX 88 MCG tablet  04/16/20  Yes [provider]  acetaminophen (TYLENOL) 325 MG suppository Place 325 mg rectally every 4 (four) hours as needed.    [provider]  ibuprofen (ADVIL) 200 MG tablet Take 200 mg by mouth every 6 (six) hours as needed.    [provider]    Allergies as of 08/20/2022   (Not on File)    History reviewed. No pertinent family history.  Social History   Socioeconomic History   Marital status: Widowed    Spouse name: Not on file   Number of children: Not on file   Years of education: Not on file   Highest education level: Not on file  Occupational History   Not on file  Tobacco Use   Smoking status: Former    Types: Cigarettes    Quit date: 09/09/2011    Years since quitting: 11.0   Smokeless tobacco: Never  Substance and Sexual Activity   Alcohol use: Not on file   Drug use: Not on file   Sexual activity: Not on file  Other Topics Concern   Not on file  Social History Narrative   Not on file   Social  Determinants of Health   Financial Resource Strain: Not on file  Food Insecurity: Not on file  Transportation Needs: Not on file  Physical Activity: Not on file  Stress: Not on file  Social Connections: Not on file  Intimate Partner Violence: Not on file    Review of Systems: See HPI, otherwise negative ROS  Physical Exam: BP (!) 146/93   Temp (!) 97.3 F (36.3 C) (Tympanic)   Ht 5' 2.01" (1.575 m)   Wt 68.3 kg   SpO2 99%   BMI 27.54 kg/m  General:   Alert,  pleasant and cooperative in NAD Head:  Normocephalic and atraumatic. Lungs:  Clear to auscultation.    Heart:  Regular rate and rhythm.   Impression/Plan: Diana Wright is here for ophthalmic surgery.  Risks, benefits, limitations, and alternatives regarding ophthalmic surgery have been reviewed with the patient.  Questions have been answered.  All parties agreeable.   Leandrew Koyanagi, MD  09/24/2022, 10:42 AM

## 2022-09-25 ENCOUNTER — Encounter: Payer: Self-pay | Admitting: Ophthalmology

## 2022-10-27 ENCOUNTER — Ambulatory Visit
Admission: RE | Admit: 2022-10-27 | Discharge: 2022-10-27 | Disposition: A | Discharge status: Home / Self Care | Payer: Medicare Other | Source: Ambulatory Visit | Attending: Acute Care | Admitting: Acute Care

## 2022-10-27 DIAGNOSIS — J439 Emphysema, unspecified: Secondary | ICD-10-CM | POA: Diagnosis not present

## 2022-10-27 DIAGNOSIS — Z87891 Personal history of nicotine dependence: Secondary | ICD-10-CM | POA: Diagnosis present

## 2022-10-27 DIAGNOSIS — Z122 Encounter for screening for malignant neoplasm of respiratory organs: Secondary | ICD-10-CM | POA: Insufficient documentation

## 2022-10-27 DIAGNOSIS — I700 Atherosclerosis of aorta: Secondary | ICD-10-CM | POA: Diagnosis not present

## 2022-10-27 DIAGNOSIS — R599 Enlarged lymph nodes, unspecified: Secondary | ICD-10-CM | POA: Diagnosis not present

## 2022-10-29 ENCOUNTER — Other Ambulatory Visit: Payer: Self-pay

## 2022-10-29 DIAGNOSIS — Z87891 Personal history of nicotine dependence: Secondary | ICD-10-CM

## 2023-01-08 IMAGING — CT CT CHEST LUNG CANCER SCREENING LOW DOSE W/O CM
2 of 5 series · 15 of 40 positions shown, 18 images · non-contrast
Comparison: 08/27/2020.

CLINICAL DATA: Former smoker, quit 10 years ago, 49 pack-year
history.



[Series 3: lung 1.00 · axial · 0.62mm/px · z∈[-1143,-889]mm · 12 of 280 slices shown, 15 images]
[im 13/280  mediastinal]
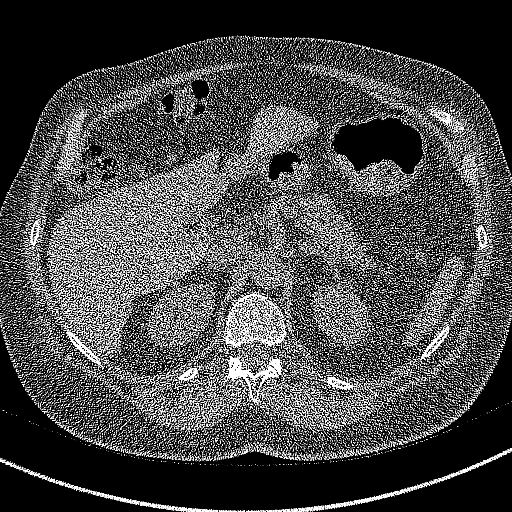
[im 13/280  lung]
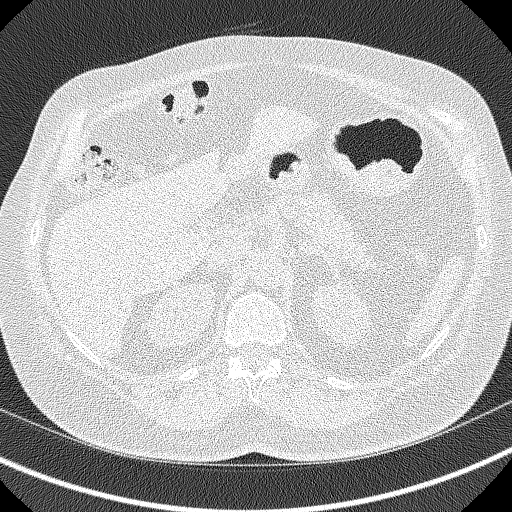
[im 39/280  lung]
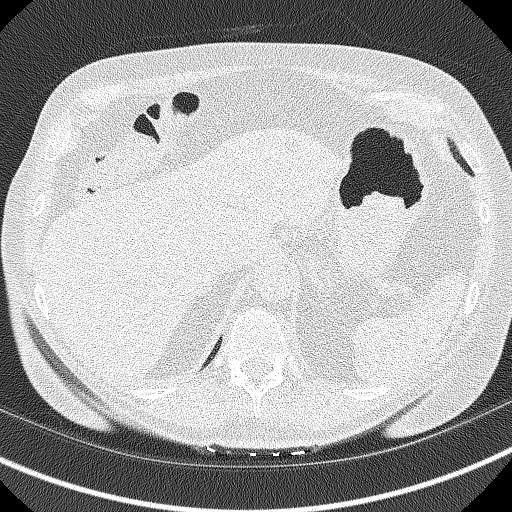
[im 64/280  lung]
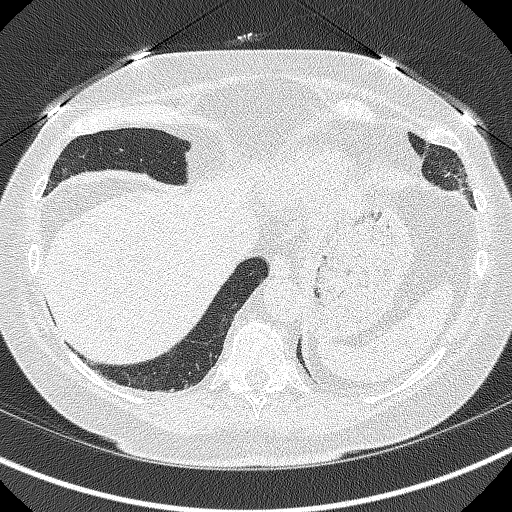
[im 89/280  lung]
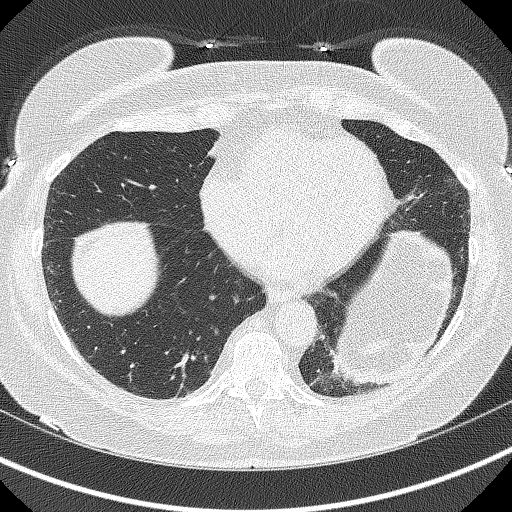
[im 102/280  mediastinal]
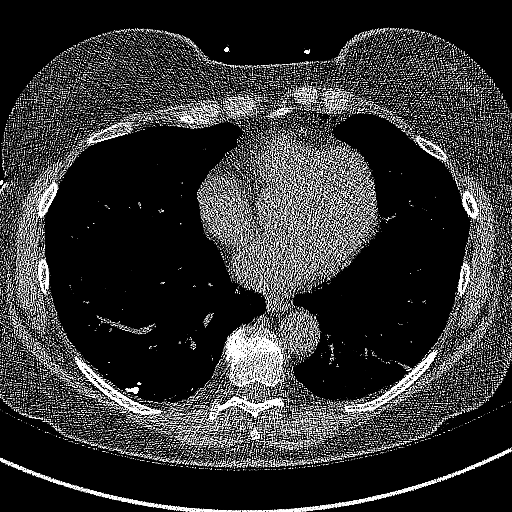
[im 102/280  lung]
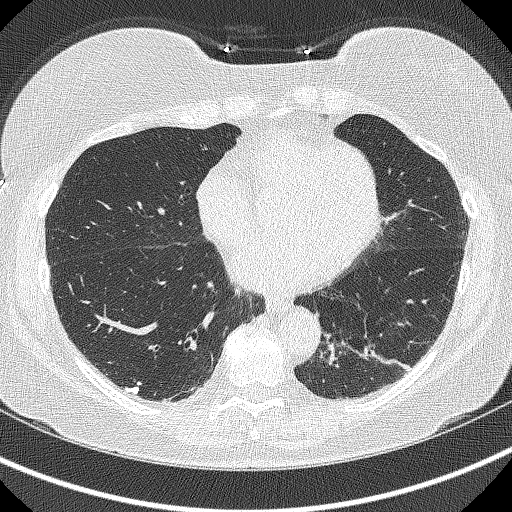
[im 127/280  lung]
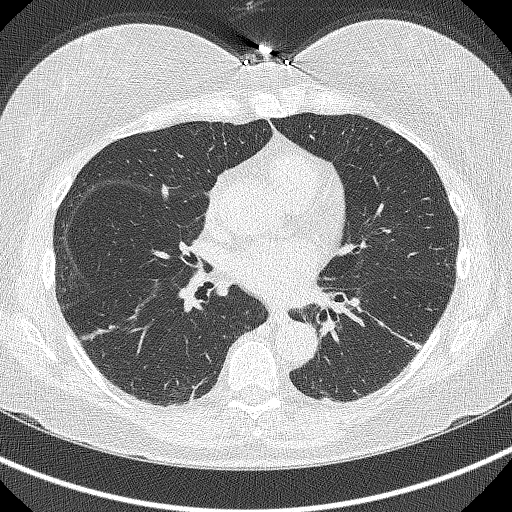
[im 153/280  lung]
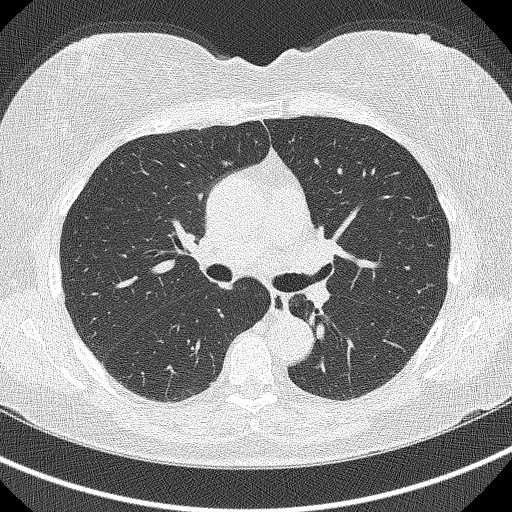
[im 178/280  lung]
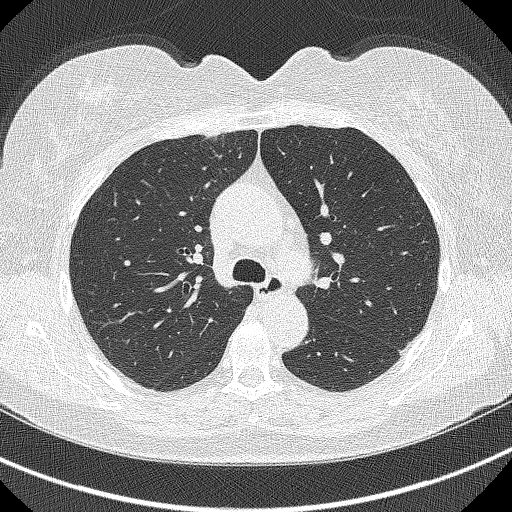
[im 191/280  mediastinal]
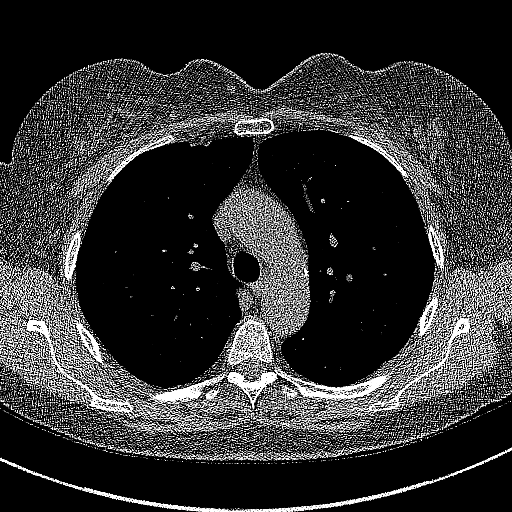
[im 191/280  lung]
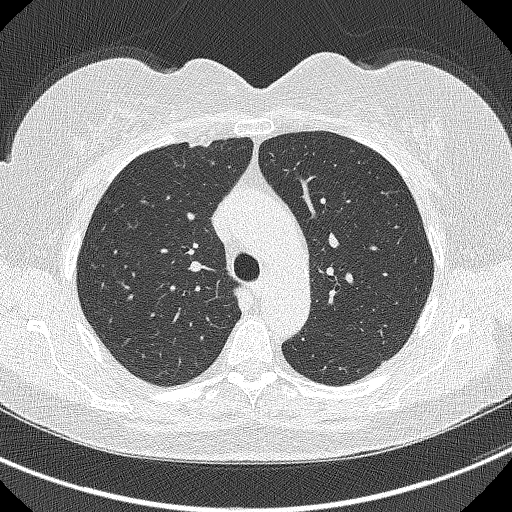
[im 216/280  lung]
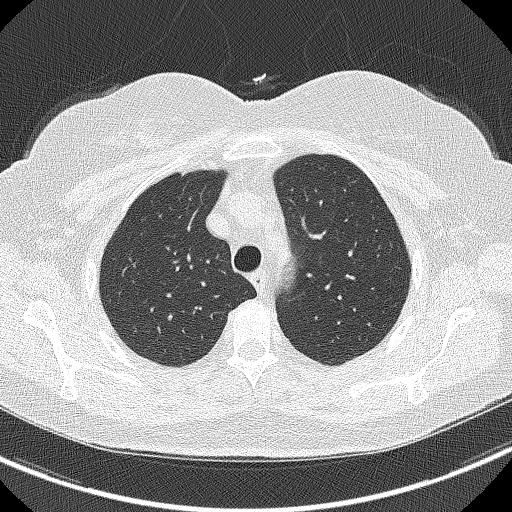
[im 241/280  lung]
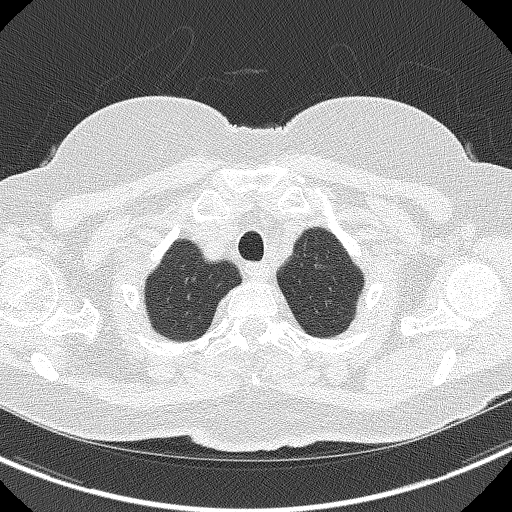
[im 267/280  lung]
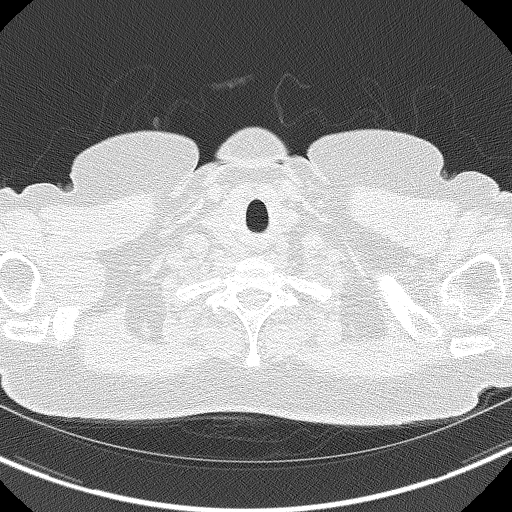

[Series 5: coronals lung 1.00 cor · coronal · 0.55mm/px · 3 of 272 slices shown]
[im 55/272  lung]
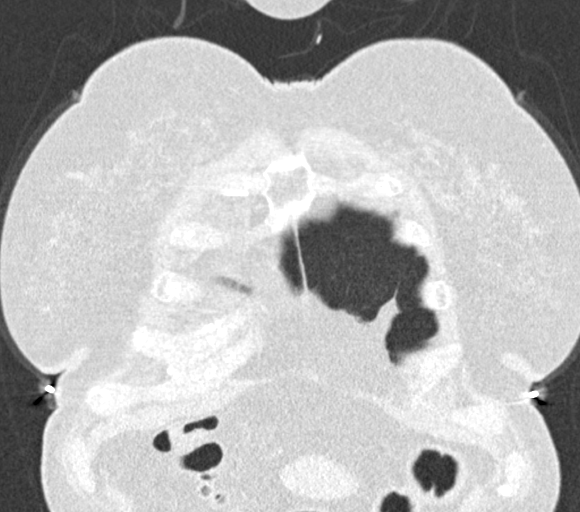
[im 109/272  lung]
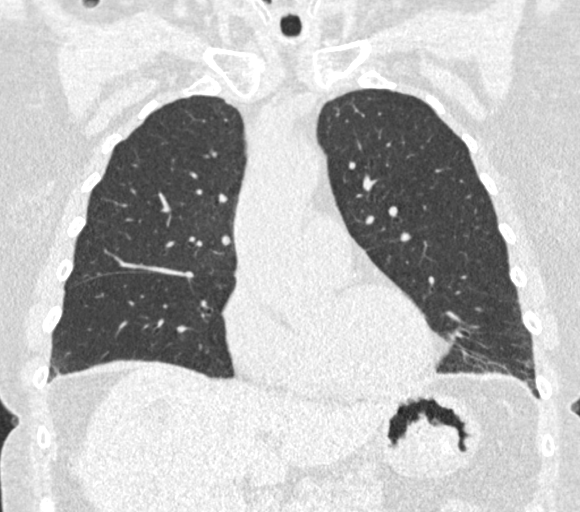
[im 163/272  lung]
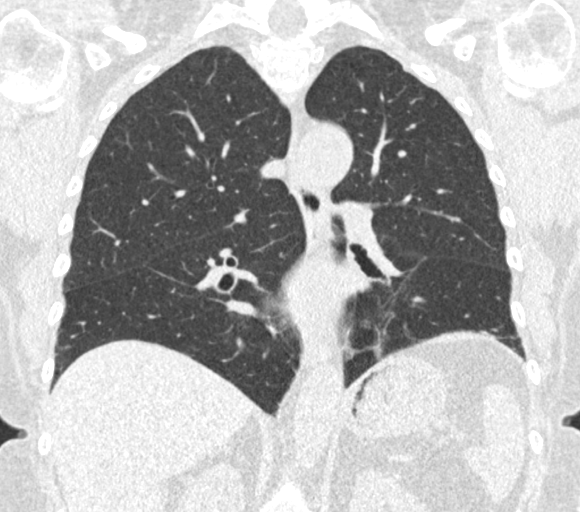

[15 of 40 positions shown; findings below may reference images not displayed]

FINDINGS: Cardiovascular: Atherosclerotic calcification of the aorta. Heart
size normal. No pericardial effusion.

Mediastinum/Nodes: No pathologically enlarged mediastinal or
axillary lymph nodes. Hilar regions are difficult to definitively
evaluate without IV contrast. Esophagus is grossly unremarkable.

Lungs/Pleura: Mild centrilobular emphysema. Basilar predominant
pulmonary parenchymal scarring. Calcified right lower lobe
granuloma. No worrisome pulmonary nodules. No pleural fluid. Airway
is unremarkable.

Upper Abdomen: Low-attenuation lesions in the liver measure up to
2.4 cm, similar and likely cysts. Visualized portions of the liver,
adrenal glands, kidneys, spleen, pancreas, stomach and bowel are
otherwise unremarkable.

Musculoskeletal: Probable bone island in the midthoracic spine. No
worrisome lytic or sclerotic lesions. Degenerative changes in the
spine.
IMPRESSION: 1. Lung-RADS 1, negative. Continue annual screening with low-dose
chest CT without contrast in 12 months.
2.  Aortic atherosclerosis ([id number].T).
3.  Emphysema ([id number]-VF8.A).

## 2023-09-23 ENCOUNTER — Telehealth: Payer: Self-pay | Admitting: Acute Care

## 2023-09-23 NOTE — Telephone Encounter (Signed)
 Returned call from VM to schedule annual LDCT.  No answer and phone was disconnected after 3 rings.  Unable to leave VM.

## 2023-09-24 ENCOUNTER — Telehealth: Payer: Self-pay

## 2023-09-24 NOTE — Telephone Encounter (Signed)
 Spoke with patient. Advises she is no longer interested in participating in the LCS program. States now that she has to pay for the scans she prefers not to complete annual scans.

## 2023-10-09 ENCOUNTER — Other Ambulatory Visit: Payer: Self-pay | Admitting: Internal Medicine

## 2023-10-09 DIAGNOSIS — Z87891 Personal history of nicotine dependence: Secondary | ICD-10-CM
